# Patient Record
Sex: Female | Born: 1960 | Race: White | Hispanic: No | Marital: Married | Smoking: Never smoker
Health system: Southern US, Community
[De-identification: ages and names within clinical notes are randomized; demographics above are authoritative.]

## PROBLEM LIST (undated history)

## (undated) DIAGNOSIS — I10 Essential (primary) hypertension: Secondary | ICD-10-CM

---

## 2016-06-01 ENCOUNTER — Ambulatory Visit
Admission: EM | Admit: 2016-06-01 | Discharge: 2016-06-01 | Disposition: A | Discharge status: Home / Self Care | Payer: BLUE CROSS/BLUE SHIELD | Attending: Family Medicine | Admitting: Family Medicine

## 2016-06-01 ENCOUNTER — Encounter: Payer: Self-pay | Admitting: Emergency Medicine

## 2016-06-01 ENCOUNTER — Ambulatory Visit (INDEPENDENT_AMBULATORY_CARE_PROVIDER_SITE_OTHER): Payer: BLUE CROSS/BLUE SHIELD

## 2016-06-01 DIAGNOSIS — B9789 Other viral agents as the cause of diseases classified elsewhere: Secondary | ICD-10-CM

## 2016-06-01 DIAGNOSIS — H6983 Other specified disorders of Eustachian tube, bilateral: Secondary | ICD-10-CM | POA: Diagnosis not present

## 2016-06-01 DIAGNOSIS — J069 Acute upper respiratory infection, unspecified: Secondary | ICD-10-CM | POA: Diagnosis not present

## 2016-06-01 DIAGNOSIS — S7001XA Contusion of right hip, initial encounter: Secondary | ICD-10-CM

## 2016-06-01 DIAGNOSIS — W19XXXA Unspecified fall, initial encounter: Secondary | ICD-10-CM

## 2016-06-01 HISTORY — DX: Essential (primary) hypertension: I10

## 2016-06-01 LAB — RAPID INFLUENZA A&B ANTIGENS
Influenza A (ARMC): NEGATIVE
Influenza B (ARMC): NEGATIVE

## 2016-06-01 MED ORDER — MELOXICAM 15 MG PO TABS: 15.0000 mg | ORAL_TABLET | Freq: Every day | ORAL | 0 refills | Status: AC

## 2016-06-01 MED ORDER — FEXOFENADINE-PSEUDOEPHED ER 180-240 MG PO TB24: 1.0000 | ORAL_TABLET | Freq: Every day | ORAL | 0 refills | Status: AC

## 2016-06-01 MED ORDER — HYDROCOD POLST-CPM POLST ER 10-8 MG/5ML PO SUER: 5.0000 mL | Freq: Two times a day (BID) | ORAL | 0 refills | Status: AC | PRN

## 2016-06-01 MED ORDER — FLUTICASONE PROPIONATE 50 MCG/ACT NA SUSP: 2.0000 | Freq: Every day | NASAL | 2 refills | Status: AC

## 2016-06-01 NOTE — ED Provider Notes (Signed)
[facility]    CSN: [id number] Arrival date & time: 06/01/16  4098     History   Chief Complaint Chief Complaint  Patient presents with  . Cough    HPI Shannon Macdonald is a 55 y.o. female.   She is here because of URI symptoms. According to her and her daughter and things started about 3 days ago. She's had coughing and nasal congestion and stuffiness and bilateral ear pains. She feels somewhat achy but no fever they're worried with this may have the flu the plan to drive to [place] denies any significant sore throat. She has a history hypertension status post C-section. She never smoked she is looked to sulfa and peanuts. She is playing to drive to [place] this afternoon and one being confined car she had a flu.  Problem #2 she also reports falling and hurting her right hip about 2-3 days ago she's been able to walk on the right leg but the leg is bruised and she is worried about problems with the right hip.   The history is provided by the patient and a relative. No language interpreter was used.  Cough  Cough characteristics:  Productive Sputum characteristics:  Yellow and white Severity:  Moderate Onset quality:  Sudden Duration:  3 days Timing:  Constant Progression:  Worsening Chronicity:  New Context: upper respiratory infection   Context: not animal exposure, not exposure to allergens and not smoke exposure   Relieved by:  Nothing Worsened by:  Nothing Ineffective treatments:  None tried Associated symptoms: ear pain and myalgias   Associated symptoms: no sinus congestion, no sore throat and no wheezing   Leg Pain  Location:  Leg Time since incident:  3 days Leg location:  R leg Pain details:    Quality:  Pressure and shooting   Radiates to:  Does not radiate   Severity:  Moderate   Onset quality:  Sudden   Timing:  Constant Chronicity:  New Foreign body present:  No foreign bodies   Past Medical History:  Diagnosis Date  . Hypertension      There are no active problems to display for this patient.   Past Surgical History:  Procedure Laterality Date  . CESAREAN SECTION      OB History    No data available       Home Medications    Prior to Admission medications   Medication Sig Start Date End Date Taking? Authorizing Provider  chlorpheniramine-HYDROcodone (TUSSIONEX PENNKINETIC ER) 10-8 MG/5ML SUER Take 5 mLs by mouth every 12 (twelve) hours as needed for cough. 06/01/16   Hassan Rowan, MD  fexofenadine-pseudoephedrine (ALLEGRA-D ALLERGY & CONGESTION) 180-240 MG 24 hr tablet Take 1 tablet by mouth daily. 06/01/16   Hassan Rowan, MD  fluticasone (FLONASE) 50 MCG/ACT nasal spray Place 2 sprays into both nostrils daily. 06/01/16   Hassan Rowan, MD  meloxicam (MOBIC) 15 MG tablet Take 1 tablet (15 mg total) by mouth daily. 06/01/16   Hassan Rowan, MD    Family History History reviewed. No pertinent family history.  Social History Social History  Substance Use Topics  . Smoking status: Never Smoker  . Smokeless tobacco: Never Used  . Alcohol use Yes     Allergies   Peanut-containing drug products and Sulfa antibiotics   Review of Systems Review of Systems  HENT: Positive for ear pain and sinus pressure. Negative for sore throat.   Respiratory: Positive for cough. Negative for wheezing.   Musculoskeletal: Positive for myalgias.  Physical Exam Triage Vital Signs ED Triage Vitals  Enc Vitals Group     BP 06/01/16 1024 (!) 154/90     Pulse Rate 06/01/16 1024 82     Resp 06/01/16 1024 16     Temp 06/01/16 1024 97.8 F (36.6 C)     Temp Source 06/01/16 1024 Oral     SpO2 06/01/16 1024 98 %     Weight 06/01/16 1024 156 lb (70.8 kg)     Height 06/01/16 1024 5\' 1"  (1.549 m)     Head Circumference --      Peak Flow --      Pain Score 06/01/16 1027 7     Pain Loc --      Pain Edu? --      Excl. in GC? --    No data found.   Updated Vital Signs BP (!) 154/90 (BP Location: Left Arm)   Pulse 82    Temp 97.8 F (36.6 C) (Oral)   Resp 16   Ht 5\' 1"  (1.549 m)   Wt 156 lb (70.8 kg)   SpO2 98%   BMI 29.48 kg/m   Visual Acuity Right Eye Distance:   Left Eye Distance:   Bilateral Distance:    Right Eye Near:   Left Eye Near:    Bilateral Near:     Physical Exam  Constitutional: She is oriented to person, place, and time. She appears well-developed and well-nourished.  HENT:  Head: Normocephalic and atraumatic.  Right Ear: Hearing, tympanic membrane, external ear and ear canal normal.  Left Ear: Hearing, tympanic membrane and external ear normal. A foreign body is present.  Nose: Mucosal edema present. Right sinus exhibits no maxillary sinus tenderness and no frontal sinus tenderness. Left sinus exhibits no maxillary sinus tenderness and no frontal sinus tenderness.  Mouth/Throat: Uvula is midline. No oral lesions. No oropharyngeal exudate, posterior oropharyngeal edema or posterior oropharyngeal erythema.  Large amount wax in the left ear but able to see some of the eardrum is normal  Neck: Normal range of motion. Neck supple.  Cardiovascular: Normal rate and regular rhythm.   Pulmonary/Chest: Effort normal and breath sounds normal. No respiratory distress.  Musculoskeletal: Normal range of motion. She exhibits tenderness.       Right hip: She exhibits tenderness. She exhibits normal range of motion and normal strength.       Legs: Patient has bruising over the superior aspect of the right femur tender to palpation  Lymphadenopathy:    She has cervical adenopathy.  Neurological: She is alert and oriented to person, place, and time.  Skin: Skin is warm.  Psychiatric: She has a normal mood and affect.  Vitals reviewed.    UC Treatments / Results  Labs (all labs ordered are listed, but only abnormal results are displayed) Labs Reviewed  RAPID INFLUENZA A&B ANTIGENS [facility] ONLY)    EKG  EKG Interpretation None       Radiology Dg Femur Min 2 Views Right  Result  Date: 06/01/2016 CLINICAL DATA:  Pt [facility] [employer] [facility], pain and bruising lateral right hip. Pt is able to walk and bear weight. EXAM: RIGHT FEMUR 2 VIEWS COMPARISON:  None. FINDINGS: There is no evidence of fracture or other focal bone lesions. Soft tissues are unremarkable. IMPRESSION: Negative. Electronically Signed   By: Amie Portlandavid  Ormond M.D.   On: 06/01/2016 12:12    Procedures Procedures (including critical care time)  Medications Ordered in UC Medications -  No data to display   Initial Impression / Assessment and Plan / UC Course  I have reviewed the triage vital signs and the nursing notes.  Pertinent labs & imaging results that were available during my care of the patient were reviewed by me and considered in my medical decision making (see chart for details).   Results for orders placed or performed during the hospital encounter of 06/01/16  Rapid Influenza A&B Antigens ([facility] only)  Result Value Ref Range   Influenza A ([facility]) NEGATIVE NEGATIVE   Influenza B ([facility]) NEGATIVE NEGATIVE   Clinical Course    X-ray of the hip was negative for any fracture through test was negative and patient thinks she has eustachian tube dysfunction which is causing her ear pain recommend Allegra-D Flonase nasal spray Mobic for the hip pain and test next cough for 5-4 days. If she is not better since please see PCP when [place] gets [place]    Final Clinical Impressions(s) / UC Diagnoses   Final diagnoses:  Fall  Acute upper respiratory infection  Eustachian tube dysfunction, bilateral  Viral URI with cough  Contusion of right hip, initial encounter  Fall, initial encounter    New Prescriptions New Prescriptions   CHLORPHENIRAMINE-HYDROCODONE (TUSSIONEX PENNKINETIC ER) 10-8 MG/5ML SUER    Take 5 mLs by mouth every 12 (twelve) hours as needed for cough.   FEXOFENADINE-PSEUDOEPHEDRINE (ALLEGRA-D ALLERGY & CONGESTION) 180-240 MG 24 HR TABLET    Take 1 tablet  by mouth daily.   FLUTICASONE (FLONASE) 50 MCG/ACT NASAL SPRAY    Place 2 sprays into both nostrils daily.   MELOXICAM (MOBIC) 15 MG TABLET    Take 1 tablet (15 mg total) by mouth daily.     Hassan RowanEugene Tharon Kitch, MD 06/01/16 [phone]

## 2016-06-01 NOTE — ED Triage Notes (Signed)
Patient c/o cough, chest congestion, nasuea, chills and sneezing that started 3 days ago.

## 2018-04-01 IMAGING — CR DG FEMUR 2+V*R*
4 series · 4 of 4 positions shown · non-contrast
Comparison: None.

CLINICAL DATA: Pt states she fell while shopping at [employer], pain and bruising lateral right hip. Pt is able to walk and
bear weight.

EXAM:
RIGHT FEMUR 2 VIEWS

[femur ap (1 of 2)]
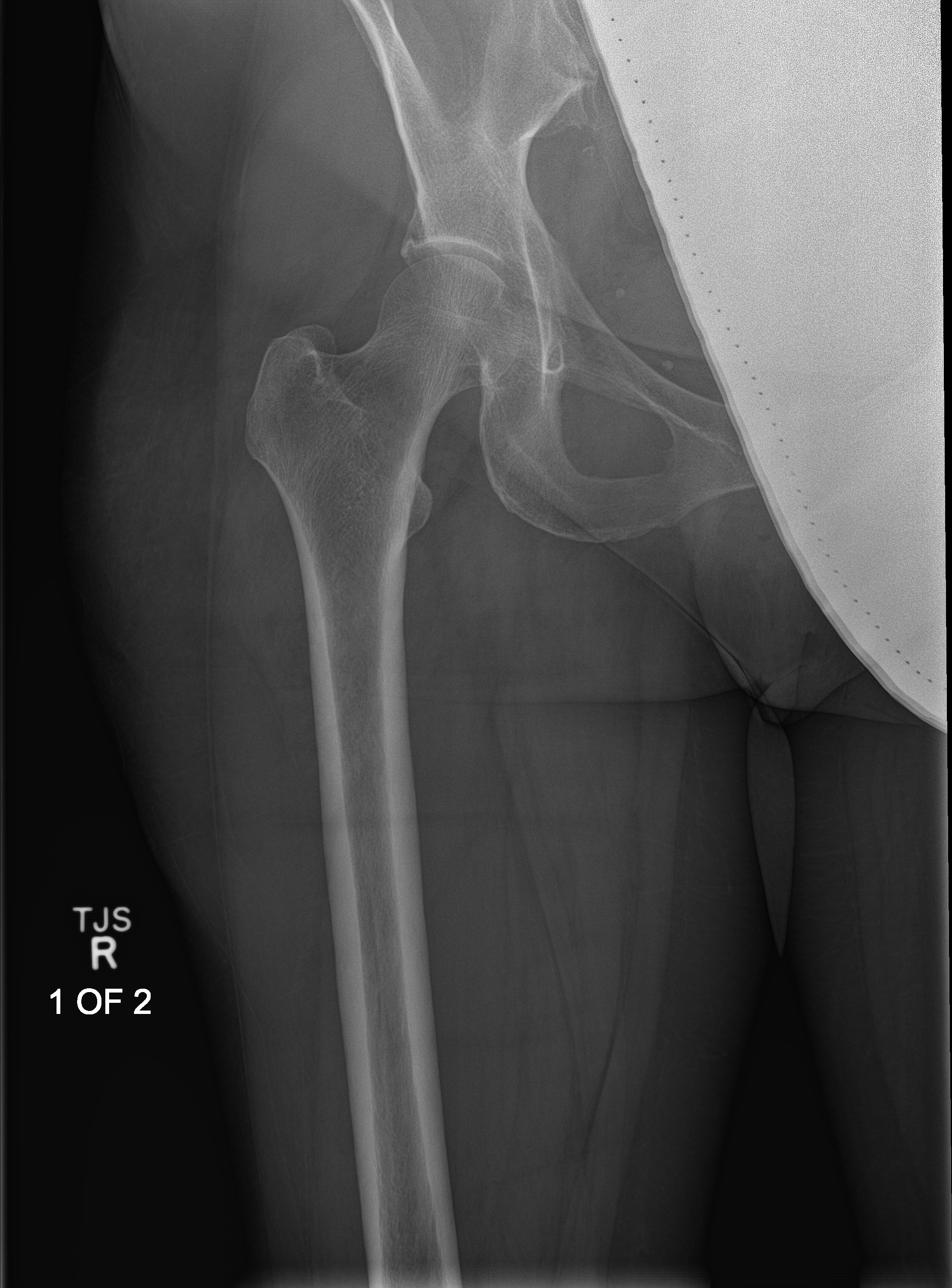

[femur ap (2 of 2)]
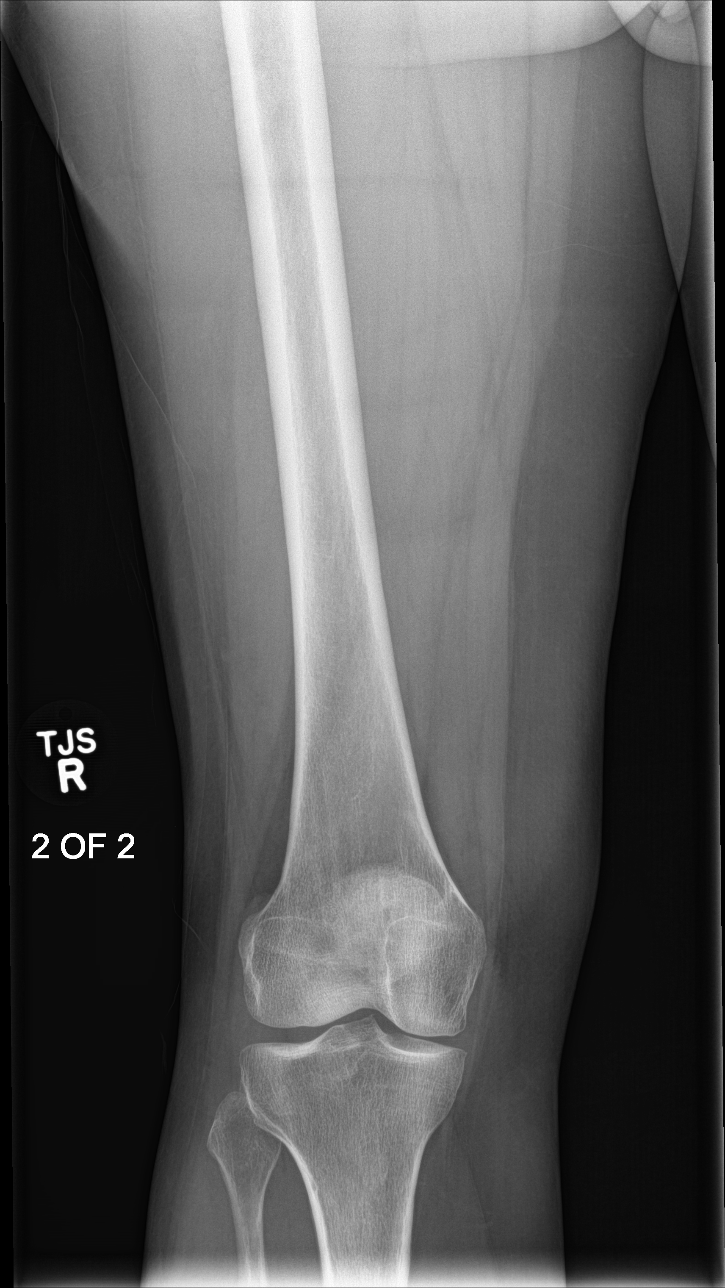

[femur lat (1 of 2)]
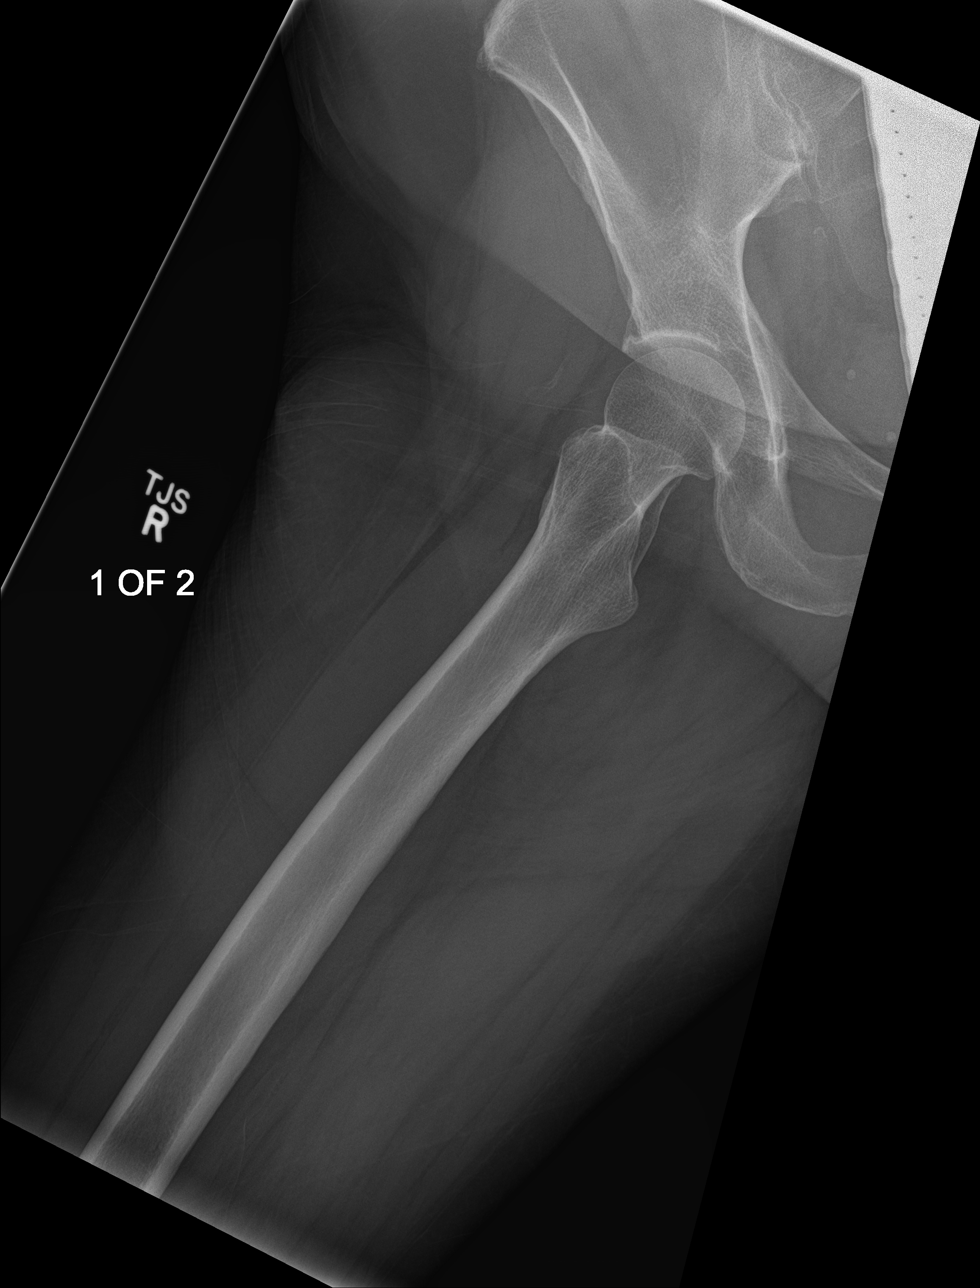

[femur lat (2 of 2)]
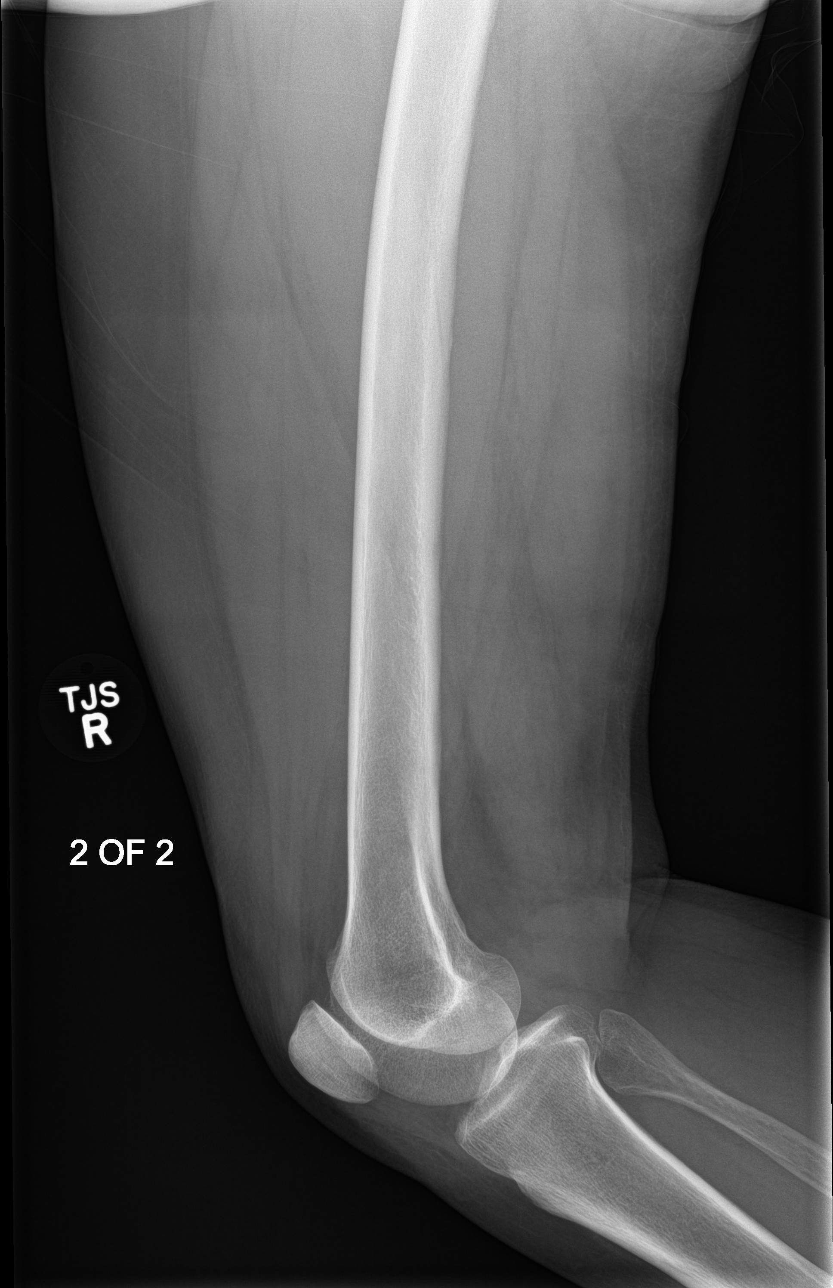

[4 of 4 positions shown; findings below may reference images not displayed]

FINDINGS: There is no evidence of fracture or other focal bone lesions. Soft
tissues are unremarkable.
IMPRESSION: Negative.
